# Patient Record
Sex: Male | Born: 1945
Health system: Southern US, Community
[De-identification: ages and names within clinical notes are randomized; demographics above are authoritative.]

---

## 2004-07-18 ENCOUNTER — Ambulatory Visit: Payer: Self-pay | Admitting: Family Medicine

## 2004-08-01 ENCOUNTER — Ambulatory Visit: Payer: Self-pay | Admitting: Cardiology

## 2004-09-05 ENCOUNTER — Ambulatory Visit: Payer: Self-pay | Admitting: Family Medicine

## 2004-09-20 ENCOUNTER — Ambulatory Visit: Payer: Self-pay | Admitting: Family Medicine

## 2004-09-22 ENCOUNTER — Ambulatory Visit: Payer: Self-pay

## 2004-09-26 ENCOUNTER — Ambulatory Visit: Payer: Self-pay | Admitting: Family Medicine

## 2004-10-03 ENCOUNTER — Ambulatory Visit: Payer: Self-pay | Admitting: Family Medicine

## 2006-05-13 ENCOUNTER — Encounter: Admission: RE | Admit: 2006-05-13 | Discharge: 2006-05-13 | Payer: Self-pay | Admitting: Internal Medicine

## 2008-10-07 ENCOUNTER — Inpatient Hospital Stay (HOSPITAL_COMMUNITY): Admission: RE | Admit: 2008-10-07 | Discharge: 2008-10-11 | Payer: Self-pay | Admitting: Orthopedic Surgery

## 2010-06-08 ENCOUNTER — Encounter: Payer: Self-pay | Admitting: Gastroenterology

## 2010-08-01 IMAGING — CR DG CHEST 2V
2 series · 2 of 2 positions shown · non-contrast
Comparison: None

CLINICAL DATA: preadmission radiograph.

CHEST - 2 VIEW

[view not recorded (1 of 2)]
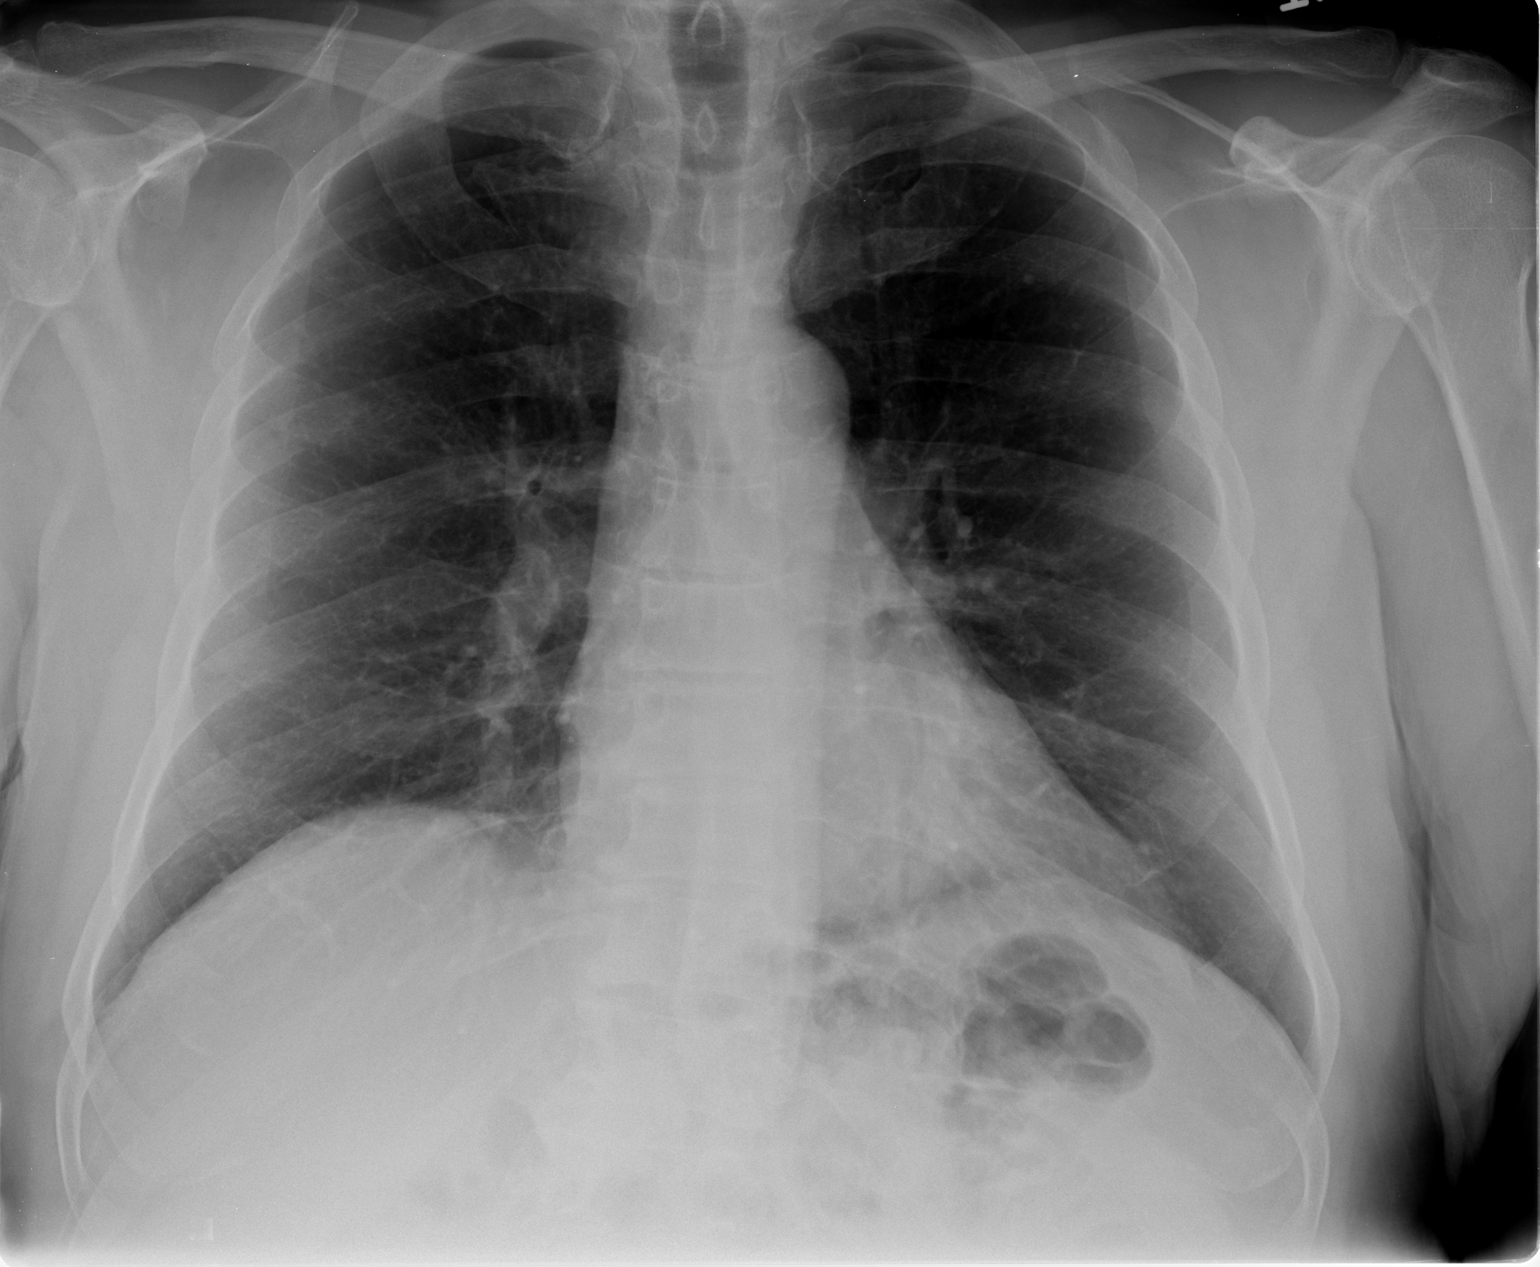

[view not recorded (2 of 2)]
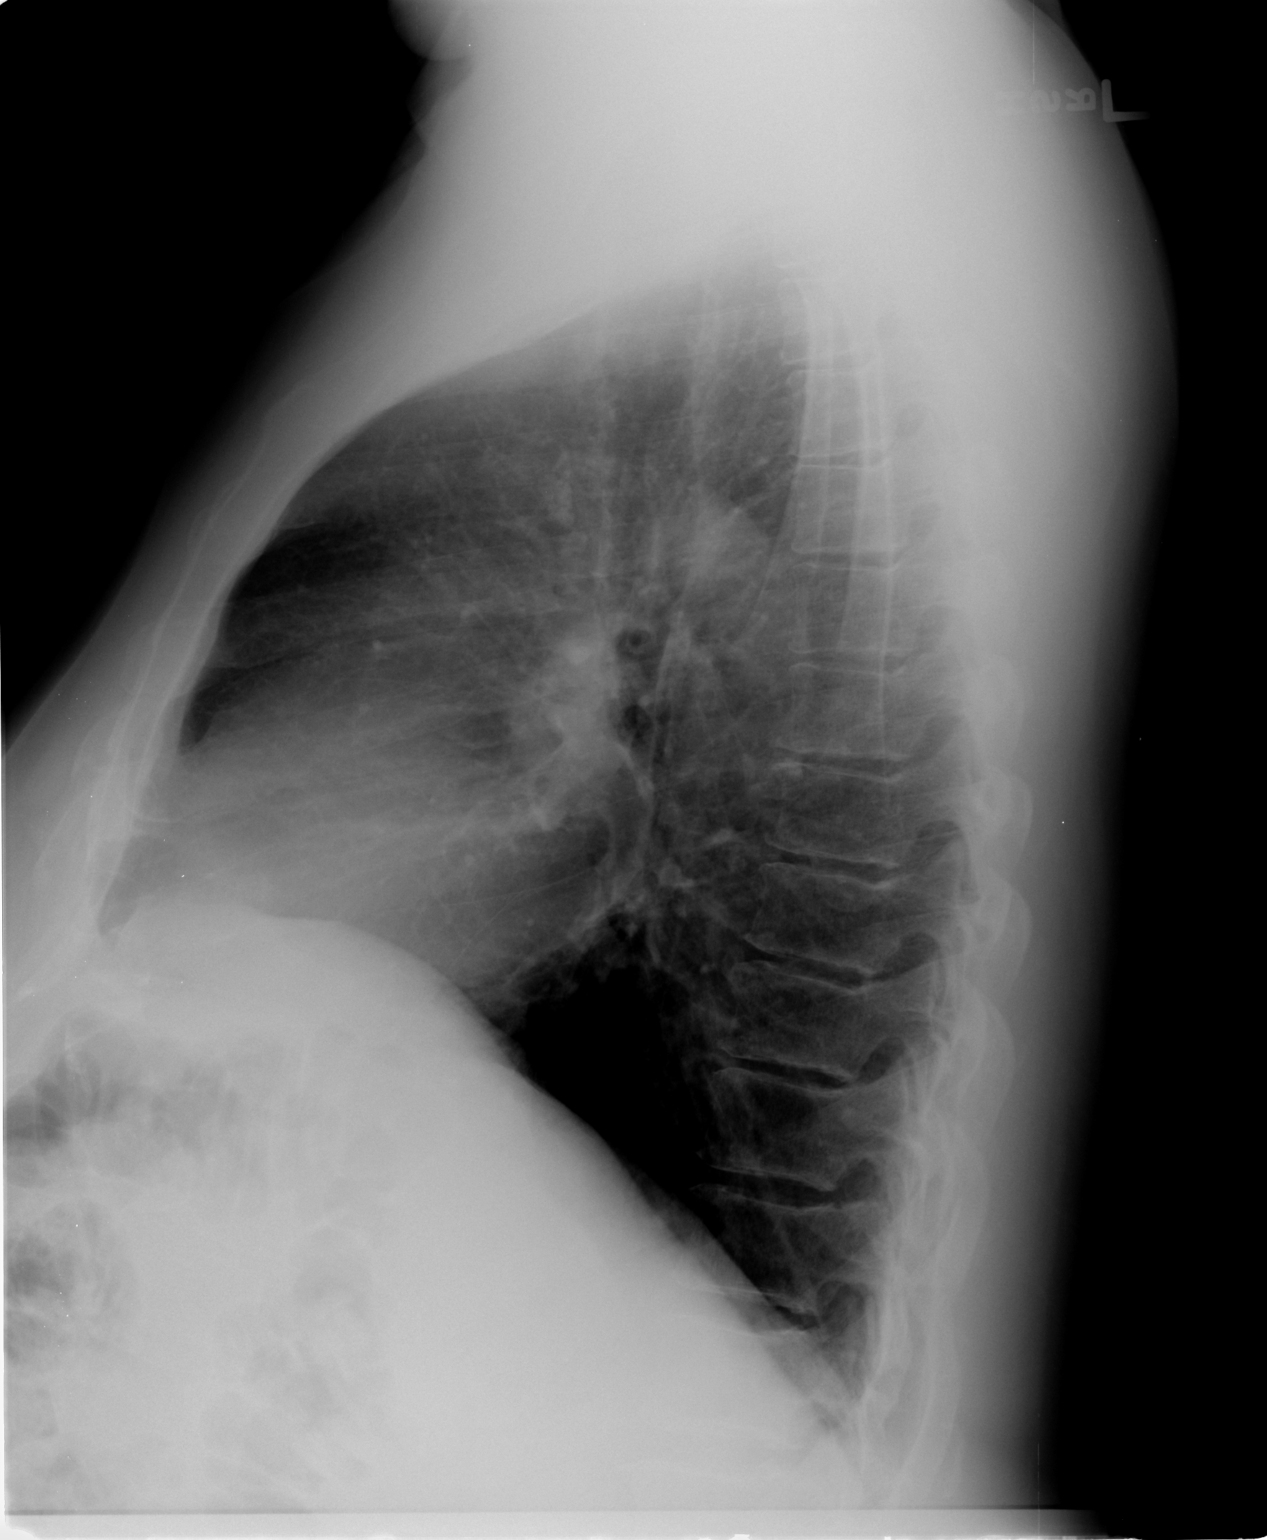

[2 of 2 positions shown; findings below may reference images not displayed]

FINDINGS: Heart size is normal.

No pleural effusion or pulmonary edema.

There is no airspace densities identified.

Review of the visualized osseous structures is negative.
IMPRESSION: 1.  No active cardiopulmonary disease.,

## 2010-10-18 NOTE — Letter (Signed)
Summary: Colonoscopy Letter  [facility]  [address] [place], [place] [address]   Phone: [phone]  Fax: [phone]      June 08, 2010 MRN: [id number]   Casey Willis [address] [place], [place]  [address]   Dear Mr. Mccowan,   According to your medical record, it is time for you to schedule a Colonoscopy. The American Cancer Society recommends this procedure as a method to detect early colon cancer. Patients with a family history of colon cancer, or a personal history of colon polyps or inflammatory bowel disease are at increased risk.  This letter has been generated based on the recommendations made at the time of your procedure. If you feel that in your particular situation this may no longer apply, please contact our office.  Please call our office at [phone] to schedule this appointment or to update your records at your earliest convenience.  Thank you for cooperating with [place] to provide you with the very best care possible.   Sincerely,   Barbette Hair. Arlyce Dice, M.D.  [facility] [phone]

## 2011-01-02 LAB — COMPREHENSIVE METABOLIC PANEL
Alkaline Phosphatase: 69 U/L (ref 39–117)
BUN: 16 mg/dL (ref 6–23)
CO2: 26 mEq/L (ref 19–32)
Chloride: 102 mEq/L (ref 96–112)
Creatinine, Ser: 0.86 mg/dL (ref 0.4–1.5)
GFR calc non Af Amer: >60 mL/min (ref >60)
Potassium: 4.6 mEq/L (ref 3.5–5.1)
Total Bilirubin: 0.9 mg/dL (ref 0.3–1.2)

## 2011-01-02 LAB — BASIC METABOLIC PANEL
BUN: 8 mg/dL (ref 6–23)
CO2: 29 mEq/L (ref 19–32)
Chloride: 103 mEq/L (ref 96–112)
Chloride: 97 mEq/L (ref 96–112)
Creatinine, Ser: 0.97 mg/dL (ref 0.4–1.5)
GFR calc Af Amer: >60 mL/min (ref >60)
GFR calc non Af Amer: >60 mL/min (ref >60)
Glucose, Bld: 119 mg/dL — ABNORMAL HIGH (ref 70–99)
Potassium: 3.8 mEq/L (ref 3.5–5.1)
Potassium: 3.9 mEq/L (ref 3.5–5.1)
Sodium: 133 mEq/L — ABNORMAL LOW (ref 135–145)
Sodium: 136 mEq/L (ref 135–145)

## 2011-01-02 LAB — CBC
HCT: 39.6 % (ref 39.0–52.0)
HCT: 27.6 % — ABNORMAL LOW (ref 39.0–52.0)
HCT: 32.3 % — ABNORMAL LOW (ref 39.0–52.0)
HCT: 32.6 % — ABNORMAL LOW (ref 39.0–52.0)
Hemoglobin: 13.3 g/dL (ref 13.0–17.0)
Hemoglobin: 10.9 g/dL — ABNORMAL LOW (ref 13.0–17.0)
Hemoglobin: 9.5 g/dL — ABNORMAL LOW (ref 13.0–17.0)
MCHC: 33.9 g/dL (ref 30.0–36.0)
MCHC: 34.4 g/dL (ref 30.0–36.0)
MCV: 90.4 fL (ref 78.0–100.0)
MCV: 89.9 fL (ref 78.0–100.0)
MCV: 90.3 fL (ref 78.0–100.0)
MCV: 90.5 fL (ref 78.0–100.0)
Platelets: 172 10*3/uL (ref 150–400)
Platelets: 189 10*3/uL (ref 150–400)
RBC: 4.38 MIL/uL (ref 4.22–5.81)
RBC: 3.05 MIL/uL — ABNORMAL LOW (ref 4.22–5.81)
RBC: 3.59 MIL/uL — ABNORMAL LOW (ref 4.22–5.81)
RDW: 14.3 % (ref 11.5–15.5)
WBC: 6.9 10*3/uL (ref 4.0–10.5)
WBC: 7.1 10*3/uL (ref 4.0–10.5)
WBC: 9.5 10*3/uL (ref 4.0–10.5)

## 2011-01-02 LAB — URINALYSIS, ROUTINE W REFLEX MICROSCOPIC
Bilirubin Urine: NEGATIVE
Glucose, UA: NEGATIVE mg/dL
Ketones, ur: NEGATIVE mg/dL
Protein, ur: NEGATIVE mg/dL
pH: 6.5 (ref 5.0–8.0)

## 2011-01-02 LAB — PROTIME-INR
INR: 1.8 — ABNORMAL HIGH (ref 0.00–1.49)
Prothrombin Time: 13.3 seconds (ref 11.6–15.2)
Prothrombin Time: 16.1 seconds — ABNORMAL HIGH (ref 11.6–15.2)

## 2011-01-02 LAB — ABO/RH: ABO/RH(D): O POS

## 2011-01-02 LAB — TYPE AND SCREEN

## 2011-01-02 LAB — DIFFERENTIAL
Basophils Absolute: 0 10*3/uL (ref 0.0–0.1)
Basophils Relative: 0 % (ref 0–1)
Eosinophils Relative: 2 % (ref 0–5)
Lymphocytes Relative: 26 % (ref 12–46)
Neutro Abs: 4.5 10*3/uL (ref 1.7–7.7)

## 2011-01-31 NOTE — Op Note (Signed)
[name], [name]             ACCOUNT NO.:  [id number]   MEDICAL RECORD NO.:  [id number]          PATIENT TYPE:  INP   LOCATION:  5024                         FACILITY:  [facility]   PHYSICIAN:  Casey Willis, M.D.   DATE OF BIRTH:  03/16/1946   DATE OF PROCEDURE:  10/07/2008  DATE OF DISCHARGE:                               OPERATIVE REPORT   PREOPERATIVE DIAGNOSIS:  End-stage degenerative joint disease, bilateral  knee.   POSTOPERATIVE DIAGNOSIS:  End-stage degenerative joint disease,  bilateral knee.   PROCEDURES:  1. Left total knee replacement with sigma system size five femur, size      five tibia, 12.5-mm bridging bearing, and a 41 mm all-polyethylene      patella.  2. Computer-assisted left total knee replacement.  3. Right total knee replacement with a sigma system size five femur,      size five tibia, 12.5-mm bridging bearing a 41-mm all polyethylene      patella.  4. Computer-assisted right total knee replacement.   SURGEON:  Casey Junior, MD on both the right and left knee.   ASSISTANT:  Marshia Ly, PA on both right and left knee.   BRIEF HISTORY:  Casey Willis is a 65 year old male with a long history of  having had significant degenerative joint disease of bilateral knees.  We treated conservatively for prolonged period of time.  We discussed  treatment options and felt that he needed knee replacements on both of  his knees.  He ultimately asked could these be done simultaneously and  we had a long discussion about morbidity and mortality, but we certainly  let him understand that there was about 4 times greater mortality to  doing bilateral total knees and doing one at a time.  Ultimately, he  just fell with his age and health that he wanted to have both done and  certainly we thought it was not unreasonable.  He was brought to the  operating room for this procedure.  We did take 2 units autologous blood  preoperatively to try to board off any issues  with postoperative blood  loss.  Because of the issues relative to instrumenting 4 canals, we felt  that computer assistance knee replacement would be critical to make this  as safe as possible and this was chosen to be used preoperatively.   PROCEDURE:  The patient brought to the operating room.  After adequate  anesthesia was obtained with general endotracheal anesthesia, the  patient was placed supine on the operating table.  Both knees were then  prepped and draped in usual sterile fashion.  Following this, attention  was turned to the left knee where after exsanguination of the knee,  blood pressure tourniquet was inflated to 350 mmHg.  Following this, a  midline incision was made.  Subcutaneous tissue taken down to the level  of the extensor mechanism.  A medial parapatellar arthrotomy was  undertaken, and the knee was exposed.  The retropatellar fat pad,  anterior and posterior cruciates, mediolateral meniscus were removed,  and attention was then turned to excision of osteophytes.  Two pins  were  then placed in the tibia, two pins in the femur to allow for computer  assistance, and the rays were placed, the registration process was  undertaken and that was about 30 minutes of surgical procedure.  Once  this was accomplished, the tibia was cut perpendicular to its long axis  under computer assistance.  The femur was cut perpendicular to the  anatomic axis under computer assistance.  Following this, the rotational  alignment guide was used with both computer assistance and with a  posterior referencing guide.  Once this was completed, attention was  turned towards making the anterior and posterior cuts, chamfer cuts, and  box cut.  The tibia was then sized to a 5, drilled and keeled and the  trial poly 10 was placed.  The knee went to slight hyperextension and  little bit of varus alignment, we put a 12/5 poly and the knee came back  perfectly into extension with the dead neutral  alignment.  Attention was  then turned to the patella, which was cut down to a level of 14 mm and a  41-mm all-polyethylene was chosen as a trial.  Lugs were drilled for  this.  Lugs were drilled for the femur.  All trial components were  removed.  The leg was thoroughly and copiously irrigated with pulsatile  lavage irrigation and the final components were cemented into place.  Once the components were allowed to harden, all excess bone cement was  removed.  A trial poly had been placed to allow a bone cement to harden  and the tourniquet was let down.  Bleeders controlled with  electrocautery.  Marcaine was injected in the posterior capsule as well  as around the medial and lateral side of the knee.  At this point,  attention was turned towards placement of a Hemovac drain, which was  ultimately connected to an Autovac drainage system.  At this time, the  final polyethylene was placed.  Knee was put through a range of motion.  Excellent range of motion, stability, perfect neutral long alignment  with computer assistance, and the computer pins were then removed.  Medial parapatellar arthrotomy was closed with 1 Vicryl running, skin  with 0 and 2-0 Vicryl and skin staples.  Sterile compressive dressing  was applied, and at this point attention was then turned to the right  knee.  The following procedures was performed on the right knee.  Because of a previous femoral plating, we knew we had to use computer  assistance as well as feeling that it was necessary because of the  bilateral nature of the procedure.  At this point, the right leg was  exsanguinated, blood pressure tourniquet was inflated to 350 mmHg.  Midline incision was made.  Subcutaneous tissues were dissected down to  the level of the extensor mechanism.  A medial parapatellar arthrotomy  was then undertaken.  Attention then turned towards the knee where  retropatellar fat pad, anterior posterior cruciates were excised as  well  as the medial lateral meniscus.  Osteophytes were removed.  Attention  was then turned towards placing two pins in the tibia, two pins in the  femur, and the arrays were placed and registration process undertaken  that was about half an hour to this surgical procedure.  Following this,  attention was turned towards cutting the tibia.  The tibia was cut  perpendicular to its long axis under computer assistance.  The femur was  then cut perpendicular to the anatomic  axis under computer assistance  and also following the posterior condyles as a reference.  Once this was  done, the anterior and posterior cuts were made as well as chamfer cuts  and a box cut and the attention was then turned to the tibia, which was  sized to a five, drilled and keeled and these trial components were  placed.  A 12.5-mm bridging bearing was placed, perfect neutral long  alignment, perfect gap balancing in extension and flexion.  There was an  interesting kind of rotational issue with the femur, which was difficult  to quite understand.  Certainly at 90 it was not bad, but at 120, there  certainly was quite a lift on from the medial side.  There was really  not much that could be done about at this time given the rotational  alignment had already been set at set at 90 degrees and I think we had  something to do with maybe rotational malunion of the femur.  At any  rate, attention was turned to the patella.  The femur was cut down to a  level of 14 and a 41-mm paddle was used and then lugs were drilled for  the patella.  Once this was completed, the trial patella was put in  place and knee put through a range of motion.  Excellent stability and  easy full extension and deep flexion.  The knee tracked reasonably well.  At this point, all trial components were removed.  Knee was copiously  and thoroughly lavaged, pulsatile lavage irrigation, and the final  components were cemented in place size five femur,  size five tibia, 12.5-  mm bridging bearing, and a 41-mm all-poly patella, and the clamp was  used on.  Once the cement was allowed to harden, tourniquet was let  down.  All bleeding was controlled with electrocautery.  Injection of  the posterior capsule for pain control and the final 12/5 poly was  placed and again range of motion was assessed and stability was  excellent, perfect neutral long alignment, computer rays were removed at  this time and a medium Hemovac drain was placed up into an Autovac.  The  medial parapatellar arthrotomy was then closed with 1 Vicryl running,  the skin with 0 and 2-0 Vicryl and skin staples.  Sterile compressive  dressing was applied to both knees at  this point and the patient was taken to recovery room and was noted to  be in satisfactory condition.  Estimated blood loss for the right knee  was less than 25 mL, the left knee had about 300 in the Autovac at the  time of the completion of the initial the case.      Casey Willis, M.D.  Electronically Signed     JLG/MEDQ  D:  10/07/2008  T:  10/08/2008  Job:  [id number]

## 2011-02-03 NOTE — Discharge Summary (Signed)
NAMEMANAS, HICKLING             ACCOUNT NO.:  [id number]   MEDICAL RECORD NO.:  [id number]          PATIENT TYPE:  INP   LOCATION:  5024                         FACILITY:  [facility]   PHYSICIAN:  Harvie Junior, M.D.   DATE OF BIRTH:  January 08, 1946   DATE OF ADMISSION:  10/07/2008  DATE OF DISCHARGE:  10/11/2008                               DISCHARGE SUMMARY   ADMITTING DIAGNOSES:  1. End-stage degenerative joint disease, bone-on-bone, right knee.  2. End-stage degenerative joint disease, bone-on-bone, left knee.  3. Hypertension.  4. Hyperlipidemia.   DISCHARGE DIAGNOSES:  1. End-stage degenerative joint disease, bone-on-bone, right knee.  2. End-stage degenerative joint disease, bone-on-bone, left knee.  3. Hypertension.  4. Hyperlipidemia.   PROCEDURES IN HOSPITAL:  Bilateral total knee arthroplasties, computer-  assisted, Harvie Junior, MD, on October 07, 2008.   CONSULTATIONS IN HOSPITAL:  None.   BRIEF HISTORY:  Mr. Ballweg is a friendly pleasant 65 year old male whom  we have followed for some time with chief complaint of bilateral knee  pain.  Weightbearing x-rays of bilateral knee shows that he had bone-on-  bone DJD in both knees.  He overtime had cortisone injections,  modification of his activity, and use of oral arthritis medications and  despite at this he continued to have pain in both knees with ambulation  and pain that is waking him up from his sleep.  Despite exhaustive  conservative treatment he continued to have symptoms and based upon his  clinical and radiographic findings he is felt to be a candidate for  bilateral total knee replacements and is admitted for this.   PERTINENT LABORATORY STUDIES:  EKG on admission showed normal sinus  rhythm, normal EKG.  Hemoglobin on admission was 13.3, hematocrit 39.6,  indices within normal limits.  On postop day #1, his hemoglobin was 9.5,  on postop day #2 was 10.9, on postop day #3 10.9.  Protime on admission  was  13.3 seconds and INR of 1.0 and PTT of 28.  On the day of discharge,  on Coumadin therapy, his protime was 21.8 seconds and INR of 1.8.  CMET  on admission showed no abnormalities.  BMET on postop day #1 and #2 also  looked good with just a slight decreased sodium of 133 on postop day #1.  Urinalysis on admission showed no abnormalities.  Two units of  autologous blood was transfused during this hospitalization.   HOSPITAL COURSE:  The patient was brought to the operating room where he  underwent bilateral total knee arthroplasties which is well described in  Dr. Luiz Blare' operative note, this was computer assisted.  Preop, he was  given 2 g Ancef IV prophylactically as well as 8 mg of IV gentamicin.  He is given Ancef IV x24 hours prophylactically postoperatively.  He has  been on PCA morphine pump for pain control and bilateral CPM machines  for use as well as incentive spirometry.  Physical therapy was ordered  for the patient to get out of bed to the chair, weightbearing as  tolerated bilaterally.  The patient was given autologous blood  preoperatively on  the day of surgery.  In the late afternoon, he was  given 2 units of autologous blood.  He was gotten out of bed to chair  with physical therapy.  On postop day #1, his hemoglobin was 9.5,  hematocrit 27.6.  He was without complaints.  He was afebrile and his  vital signs were stable.  His lungs were clear to auscultation.  His  heart was regular rate and rhythm.  Both of his knee wounds were benign  and NV was intact to  both lower extremities.  A BMET was checked and  the renal function was great.  On postop day #2, he complained of  moderate knee pain.  He was taking fluids and voiding without  difficulty.  He was up to the bedside commode.  He had a T-max  temperature of 101 and then was found to be afebrile and his vital signs  were stable.  His O2 sats were 98% on room air.  His bilateral knee  dressings were changed and Hemovac  drains placed at the time of surgery  were removed.  His hemoglobin was 10.9.  His potassium was 3.9 and  sodium 136.  INR was 1.8.  His dressings were changed and his Hemovac  drains were pulled.  His PCA morphine pump was discontinued.  His IV was  converted to a saline lock.  He is to continue with physical therapy and  occupational therapy and continue on Coumadin for DVT prophylaxis.  On  postop day #3, he had complaints of moderate knee pain.  He was overall  progressing well with physical therapy.  He was not really stable to go  home at that point.  He had a hemoglobin of 10.9 and an INR of 1.9.  On  postop day #4, the patient was doing well and he stated he was ready to  go home.  He was taking fluids and voiding without difficulty.  He had  positive bowel movement.  He had made good progress with physical  therapy.  His vital signs were stable and he was afebrile.  His O2 sats  were 99% on room air.  His INR was 1.8.  His bilateral knee dressings  were clean and dry.  His wound was benign.  His calves were soft.  No  shortness of breath, whatsoever.  No chest pain.  He is discharged home  in improved condition on a regular diet.  He was given Rx for Percocet 5  mg p.r.n.. for pain and Robaxin 750 mg p.r.n. spasm.  He was also given  an Rx Coumadin x1 month postop for DVT prophylaxis.  He will need home  health physical therapy 3 times a week and Home Health RN for protimes  and Coumadin management and have CPMs bilaterally.  He will follow up  with Dr. Luiz Blare in the office in 10 days.  He will ambulate and  weightbearing as tolerated bilaterally.      Marshia Ly, P.A.      Harvie Junior, M.D.  Electronically Signed    JB/MEDQ  D:  12/02/2008  T:  12/03/2008  Job:  [id number]   cc:   Thora Lance, M.D.

## 2012-05-21 ENCOUNTER — Encounter: Payer: Self-pay | Admitting: Gastroenterology

## 2013-09-22 DIAGNOSIS — H259 Unspecified age-related cataract: Secondary | ICD-10-CM | POA: Diagnosis not present

## 2013-10-24 DIAGNOSIS — H60399 Other infective otitis externa, unspecified ear: Secondary | ICD-10-CM | POA: Diagnosis not present

## 2013-12-08 DIAGNOSIS — H902 Conductive hearing loss, unspecified: Secondary | ICD-10-CM | POA: Diagnosis not present

## 2013-12-08 DIAGNOSIS — H6120 Impacted cerumen, unspecified ear: Secondary | ICD-10-CM | POA: Diagnosis not present

## 2013-12-08 DIAGNOSIS — H60399 Other infective otitis externa, unspecified ear: Secondary | ICD-10-CM | POA: Diagnosis not present

## 2014-01-13 DIAGNOSIS — E785 Hyperlipidemia, unspecified: Secondary | ICD-10-CM | POA: Diagnosis not present

## 2014-01-13 DIAGNOSIS — I10 Essential (primary) hypertension: Secondary | ICD-10-CM | POA: Diagnosis not present

## 2014-04-06 DIAGNOSIS — H60399 Other infective otitis externa, unspecified ear: Secondary | ICD-10-CM | POA: Diagnosis not present

## 2014-04-06 DIAGNOSIS — H6120 Impacted cerumen, unspecified ear: Secondary | ICD-10-CM | POA: Diagnosis not present

## 2014-07-21 DIAGNOSIS — Z23 Encounter for immunization: Secondary | ICD-10-CM | POA: Diagnosis not present

## 2014-08-04 DIAGNOSIS — Z1389 Encounter for screening for other disorder: Secondary | ICD-10-CM | POA: Diagnosis not present

## 2014-08-04 DIAGNOSIS — R7301 Impaired fasting glucose: Secondary | ICD-10-CM | POA: Diagnosis not present

## 2014-08-04 DIAGNOSIS — I10 Essential (primary) hypertension: Secondary | ICD-10-CM | POA: Diagnosis not present

## 2014-08-04 DIAGNOSIS — E669 Obesity, unspecified: Secondary | ICD-10-CM | POA: Diagnosis not present

## 2014-08-04 DIAGNOSIS — Z23 Encounter for immunization: Secondary | ICD-10-CM | POA: Diagnosis not present

## 2014-08-04 DIAGNOSIS — Z0000 Encounter for general adult medical examination without abnormal findings: Secondary | ICD-10-CM | POA: Diagnosis not present

## 2014-08-04 DIAGNOSIS — Z6833 Body mass index (BMI) 33.0-33.9, adult: Secondary | ICD-10-CM | POA: Diagnosis not present

## 2014-08-04 DIAGNOSIS — R21 Rash and other nonspecific skin eruption: Secondary | ICD-10-CM | POA: Diagnosis not present

## 2014-08-04 DIAGNOSIS — E780 Pure hypercholesterolemia: Secondary | ICD-10-CM | POA: Diagnosis not present

## 2014-08-07 DIAGNOSIS — M1990 Unspecified osteoarthritis, unspecified site: Secondary | ICD-10-CM | POA: Diagnosis not present

## 2014-09-24 DIAGNOSIS — H04129 Dry eye syndrome of unspecified lacrimal gland: Secondary | ICD-10-CM | POA: Diagnosis not present

## 2014-09-24 DIAGNOSIS — H259 Unspecified age-related cataract: Secondary | ICD-10-CM | POA: Diagnosis not present

## 2014-09-30 DIAGNOSIS — E780 Pure hypercholesterolemia: Secondary | ICD-10-CM | POA: Diagnosis not present

## 2014-09-30 DIAGNOSIS — M1990 Unspecified osteoarthritis, unspecified site: Secondary | ICD-10-CM | POA: Diagnosis not present

## 2014-10-27 DIAGNOSIS — H6123 Impacted cerumen, bilateral: Secondary | ICD-10-CM | POA: Diagnosis not present

## 2014-10-27 DIAGNOSIS — H60332 Swimmer's ear, left ear: Secondary | ICD-10-CM | POA: Diagnosis not present

## 2014-12-04 DIAGNOSIS — K529 Noninfective gastroenteritis and colitis, unspecified: Secondary | ICD-10-CM | POA: Diagnosis not present

## 2014-12-16 DIAGNOSIS — J069 Acute upper respiratory infection, unspecified: Secondary | ICD-10-CM | POA: Diagnosis not present

## 2015-02-03 DIAGNOSIS — R7301 Impaired fasting glucose: Secondary | ICD-10-CM | POA: Diagnosis not present

## 2015-02-03 DIAGNOSIS — I10 Essential (primary) hypertension: Secondary | ICD-10-CM | POA: Diagnosis not present

## 2015-02-03 DIAGNOSIS — E780 Pure hypercholesterolemia: Secondary | ICD-10-CM | POA: Diagnosis not present

## 2015-03-05 DIAGNOSIS — B356 Tinea cruris: Secondary | ICD-10-CM | POA: Diagnosis not present

## 2015-03-23 DIAGNOSIS — H60332 Swimmer's ear, left ear: Secondary | ICD-10-CM | POA: Diagnosis not present

## 2015-03-23 DIAGNOSIS — H6121 Impacted cerumen, right ear: Secondary | ICD-10-CM | POA: Diagnosis not present

## 2015-08-09 DIAGNOSIS — Z1389 Encounter for screening for other disorder: Secondary | ICD-10-CM | POA: Diagnosis not present

## 2015-08-09 DIAGNOSIS — E7800 Pure hypercholesterolemia, unspecified: Secondary | ICD-10-CM | POA: Diagnosis not present

## 2015-08-09 DIAGNOSIS — Z125 Encounter for screening for malignant neoplasm of prostate: Secondary | ICD-10-CM | POA: Diagnosis not present

## 2015-08-09 DIAGNOSIS — Z6833 Body mass index (BMI) 33.0-33.9, adult: Secondary | ICD-10-CM | POA: Diagnosis not present

## 2015-08-09 DIAGNOSIS — I10 Essential (primary) hypertension: Secondary | ICD-10-CM | POA: Diagnosis not present

## 2015-08-09 DIAGNOSIS — Z23 Encounter for immunization: Secondary | ICD-10-CM | POA: Diagnosis not present

## 2015-08-09 DIAGNOSIS — E669 Obesity, unspecified: Secondary | ICD-10-CM | POA: Diagnosis not present

## 2015-08-09 DIAGNOSIS — R7301 Impaired fasting glucose: Secondary | ICD-10-CM | POA: Diagnosis not present

## 2015-08-09 DIAGNOSIS — Z0000 Encounter for general adult medical examination without abnormal findings: Secondary | ICD-10-CM | POA: Diagnosis not present

## 2015-11-10 DIAGNOSIS — H6121 Impacted cerumen, right ear: Secondary | ICD-10-CM | POA: Diagnosis not present

## 2015-11-10 DIAGNOSIS — H60332 Swimmer's ear, left ear: Secondary | ICD-10-CM | POA: Diagnosis not present

## 2015-11-18 DIAGNOSIS — H259 Unspecified age-related cataract: Secondary | ICD-10-CM | POA: Diagnosis not present

## 2016-02-07 DIAGNOSIS — I10 Essential (primary) hypertension: Secondary | ICD-10-CM | POA: Diagnosis not present

## 2016-05-11 DIAGNOSIS — H60332 Swimmer's ear, left ear: Secondary | ICD-10-CM | POA: Diagnosis not present

## 2016-05-11 DIAGNOSIS — H6121 Impacted cerumen, right ear: Secondary | ICD-10-CM | POA: Diagnosis not present

## 2016-07-12 DIAGNOSIS — Z23 Encounter for immunization: Secondary | ICD-10-CM | POA: Diagnosis not present

## 2016-07-13 DIAGNOSIS — L814 Other melanin hyperpigmentation: Secondary | ICD-10-CM | POA: Diagnosis not present

## 2016-07-13 DIAGNOSIS — D1801 Hemangioma of skin and subcutaneous tissue: Secondary | ICD-10-CM | POA: Diagnosis not present

## 2016-07-13 DIAGNOSIS — D2261 Melanocytic nevi of right upper limb, including shoulder: Secondary | ICD-10-CM | POA: Diagnosis not present

## 2016-07-13 DIAGNOSIS — D2262 Melanocytic nevi of left upper limb, including shoulder: Secondary | ICD-10-CM | POA: Diagnosis not present

## 2016-07-13 DIAGNOSIS — L821 Other seborrheic keratosis: Secondary | ICD-10-CM | POA: Diagnosis not present

## 2016-07-13 DIAGNOSIS — D225 Melanocytic nevi of trunk: Secondary | ICD-10-CM | POA: Diagnosis not present

## 2016-08-25 DIAGNOSIS — I10 Essential (primary) hypertension: Secondary | ICD-10-CM | POA: Diagnosis not present

## 2016-08-25 DIAGNOSIS — R202 Paresthesia of skin: Secondary | ICD-10-CM | POA: Diagnosis not present

## 2016-08-25 DIAGNOSIS — E7800 Pure hypercholesterolemia, unspecified: Secondary | ICD-10-CM | POA: Diagnosis not present

## 2016-08-25 DIAGNOSIS — Z1389 Encounter for screening for other disorder: Secondary | ICD-10-CM | POA: Diagnosis not present

## 2016-08-25 DIAGNOSIS — E669 Obesity, unspecified: Secondary | ICD-10-CM | POA: Diagnosis not present

## 2016-08-25 DIAGNOSIS — Z6833 Body mass index (BMI) 33.0-33.9, adult: Secondary | ICD-10-CM | POA: Diagnosis not present

## 2016-08-25 DIAGNOSIS — Z0000 Encounter for general adult medical examination without abnormal findings: Secondary | ICD-10-CM | POA: Diagnosis not present

## 2016-08-25 DIAGNOSIS — R7301 Impaired fasting glucose: Secondary | ICD-10-CM | POA: Diagnosis not present

## 2016-09-14 DIAGNOSIS — H6121 Impacted cerumen, right ear: Secondary | ICD-10-CM | POA: Diagnosis not present

## 2016-09-14 DIAGNOSIS — H60332 Swimmer's ear, left ear: Secondary | ICD-10-CM | POA: Diagnosis not present

## 2016-11-23 DIAGNOSIS — H259 Unspecified age-related cataract: Secondary | ICD-10-CM | POA: Diagnosis not present

## 2016-11-23 DIAGNOSIS — H04129 Dry eye syndrome of unspecified lacrimal gland: Secondary | ICD-10-CM | POA: Diagnosis not present

## 2016-11-23 DIAGNOSIS — H60332 Swimmer's ear, left ear: Secondary | ICD-10-CM | POA: Diagnosis not present

## 2016-11-23 DIAGNOSIS — H6122 Impacted cerumen, left ear: Secondary | ICD-10-CM | POA: Diagnosis not present

## 2016-12-07 DIAGNOSIS — H02831 Dermatochalasis of right upper eyelid: Secondary | ICD-10-CM | POA: Diagnosis not present

## 2016-12-07 DIAGNOSIS — H02834 Dermatochalasis of left upper eyelid: Secondary | ICD-10-CM | POA: Diagnosis not present

## 2017-02-09 DIAGNOSIS — H5340 Unspecified visual field defects: Secondary | ICD-10-CM | POA: Diagnosis not present

## 2017-03-15 DIAGNOSIS — I10 Essential (primary) hypertension: Secondary | ICD-10-CM | POA: Diagnosis not present

## 2017-03-20 DIAGNOSIS — H6122 Impacted cerumen, left ear: Secondary | ICD-10-CM | POA: Diagnosis not present

## 2017-05-29 DIAGNOSIS — H60332 Swimmer's ear, left ear: Secondary | ICD-10-CM | POA: Diagnosis not present

## 2017-05-29 DIAGNOSIS — H6122 Impacted cerumen, left ear: Secondary | ICD-10-CM | POA: Diagnosis not present

## 2017-06-28 DIAGNOSIS — Z23 Encounter for immunization: Secondary | ICD-10-CM | POA: Diagnosis not present

## 2017-07-13 DIAGNOSIS — D1801 Hemangioma of skin and subcutaneous tissue: Secondary | ICD-10-CM | POA: Diagnosis not present

## 2017-07-13 DIAGNOSIS — L821 Other seborrheic keratosis: Secondary | ICD-10-CM | POA: Diagnosis not present

## 2017-07-13 DIAGNOSIS — D692 Other nonthrombocytopenic purpura: Secondary | ICD-10-CM | POA: Diagnosis not present

## 2017-07-13 DIAGNOSIS — D225 Melanocytic nevi of trunk: Secondary | ICD-10-CM | POA: Diagnosis not present

## 2017-08-30 DIAGNOSIS — E7800 Pure hypercholesterolemia, unspecified: Secondary | ICD-10-CM | POA: Diagnosis not present

## 2017-08-30 DIAGNOSIS — I10 Essential (primary) hypertension: Secondary | ICD-10-CM | POA: Diagnosis not present

## 2017-08-30 DIAGNOSIS — Z1389 Encounter for screening for other disorder: Secondary | ICD-10-CM | POA: Diagnosis not present

## 2017-08-30 DIAGNOSIS — R7301 Impaired fasting glucose: Secondary | ICD-10-CM | POA: Diagnosis not present

## 2017-08-30 DIAGNOSIS — E781 Pure hyperglyceridemia: Secondary | ICD-10-CM | POA: Diagnosis not present

## 2017-08-30 DIAGNOSIS — E669 Obesity, unspecified: Secondary | ICD-10-CM | POA: Diagnosis not present

## 2017-08-30 DIAGNOSIS — Z0000 Encounter for general adult medical examination without abnormal findings: Secondary | ICD-10-CM | POA: Diagnosis not present

## 2017-08-30 DIAGNOSIS — Z125 Encounter for screening for malignant neoplasm of prostate: Secondary | ICD-10-CM | POA: Diagnosis not present

## 2017-11-29 DIAGNOSIS — H259 Unspecified age-related cataract: Secondary | ICD-10-CM | POA: Diagnosis not present

## 2017-11-29 DIAGNOSIS — H04123 Dry eye syndrome of bilateral lacrimal glands: Secondary | ICD-10-CM | POA: Diagnosis not present

## 2017-12-26 DIAGNOSIS — J209 Acute bronchitis, unspecified: Secondary | ICD-10-CM | POA: Diagnosis not present

## 2017-12-26 DIAGNOSIS — N529 Male erectile dysfunction, unspecified: Secondary | ICD-10-CM | POA: Diagnosis not present

## 2018-02-26 DIAGNOSIS — M25561 Pain in right knee: Secondary | ICD-10-CM | POA: Diagnosis not present

## 2018-02-26 DIAGNOSIS — M25562 Pain in left knee: Secondary | ICD-10-CM | POA: Diagnosis not present

## 2018-02-26 DIAGNOSIS — G5601 Carpal tunnel syndrome, right upper limb: Secondary | ICD-10-CM | POA: Diagnosis not present

## 2018-02-26 DIAGNOSIS — Z96651 Presence of right artificial knee joint: Secondary | ICD-10-CM | POA: Diagnosis not present

## 2018-03-01 DIAGNOSIS — N529 Male erectile dysfunction, unspecified: Secondary | ICD-10-CM | POA: Diagnosis not present

## 2018-03-01 DIAGNOSIS — R7301 Impaired fasting glucose: Secondary | ICD-10-CM | POA: Diagnosis not present

## 2018-03-01 DIAGNOSIS — G5601 Carpal tunnel syndrome, right upper limb: Secondary | ICD-10-CM | POA: Diagnosis not present

## 2018-03-01 DIAGNOSIS — I10 Essential (primary) hypertension: Secondary | ICD-10-CM | POA: Diagnosis not present

## 2018-03-01 DIAGNOSIS — E7800 Pure hypercholesterolemia, unspecified: Secondary | ICD-10-CM | POA: Diagnosis not present

## 2018-03-12 DIAGNOSIS — Z96651 Presence of right artificial knee joint: Secondary | ICD-10-CM | POA: Diagnosis not present

## 2018-03-12 DIAGNOSIS — G5601 Carpal tunnel syndrome, right upper limb: Secondary | ICD-10-CM | POA: Diagnosis not present

## 2018-03-12 DIAGNOSIS — Z96652 Presence of left artificial knee joint: Secondary | ICD-10-CM | POA: Diagnosis not present

## 2018-04-16 DIAGNOSIS — G5601 Carpal tunnel syndrome, right upper limb: Secondary | ICD-10-CM | POA: Diagnosis not present

## 2018-04-16 DIAGNOSIS — M67911 Unspecified disorder of synovium and tendon, right shoulder: Secondary | ICD-10-CM | POA: Diagnosis not present

## 2018-06-06 DIAGNOSIS — G5601 Carpal tunnel syndrome, right upper limb: Secondary | ICD-10-CM | POA: Diagnosis not present

## 2018-06-26 DIAGNOSIS — Z23 Encounter for immunization: Secondary | ICD-10-CM | POA: Diagnosis not present

## 2018-07-05 ENCOUNTER — Encounter (INDEPENDENT_AMBULATORY_CARE_PROVIDER_SITE_OTHER): Payer: Medicare Other | Admitting: Neurology

## 2018-07-05 ENCOUNTER — Ambulatory Visit (INDEPENDENT_AMBULATORY_CARE_PROVIDER_SITE_OTHER): Payer: Medicare Other | Admitting: Neurology

## 2018-07-05 DIAGNOSIS — Z0289 Encounter for other administrative examinations: Secondary | ICD-10-CM

## 2018-07-05 DIAGNOSIS — G5601 Carpal tunnel syndrome, right upper limb: Secondary | ICD-10-CM

## 2018-07-05 NOTE — Procedures (Signed)
        Full Name: Casey Willis Gender: Male MRN #: [id number] Date of Birth: 03/06/2046    Visit Date: 07/05/18 11:20 Age: 72 Years 2 Months Old Examining Physician: Marcial Pacas, MD  Referring Physician: Dr. Dorna Leitz History: 72 year old right-handed male presented with intermittent right hand paresthesia involving the first 3 and half fingers  On examination: He has mild right abductor pollicis brevis, opponens, decreased light touch pinprick first 3 fingertips, right wrist Tinel sign was positive.  Summary of the tests:  Nerve conduction study: Right median sensory response was absent. Right median motor responses showed moderately prolonged distal latency, with mildly decreased the C map amplitude, conduction velocity was normal.  Right ulnar sensory and motor responses were normal.  Electromyography: Selective needle examination were performed at the right upper extremity muscles. The only abnormality is mildly decreased recruitment pattern of the right abductor pollicis brevis, there is no active denervation, motor unit morphology was normal.     Conclusion: This is an abnormal study.  There is electrodiagnostic evidence of right median neuropathy across the wrist, consistent with moderately severe right carpal tunnel syndromes, demyelinating nature, there is no evidence of axonal loss.    ------------------------------- Marcial Pacas M.D. PhD  [facility] [place], [place] [address] Tel: [phone] Fax: [phone]        [facility]    Nerve / Sites Muscle Latency Ref. Amplitude Ref. Rel Amp Segments Distance Velocity Ref. Area    ms ms mV mV %  cm m/s m/s mVms  R Median - APB     Wrist APB 7.2 ?4.4 3.9 ?4.0 100 Wrist - APB 7   18.1     Upper arm APB 11.9  3.9  99.6 Upper arm - Wrist 23 49 ?49 17.9  R Ulnar - ADM     Wrist ADM 2.7 ?3.3 13.2 ?6.0 100 Wrist - ADM 7   34.8     B.Elbow ADM 7.1  11.6  88.2 B.Elbow - Wrist 22 50 ?49 33.5     A.Elbow ADM 9.0  11.3  97 A.Elbow - B.Elbow 10 52 ?49 33.4         A.Elbow - Wrist             SNC    Nerve / Sites Rec. Site Peak Lat Ref.  Amp Ref. Segments Distance    ms ms V V  cm  R Median - Orthodromic (Dig II, Mid palm)     Dig II Wrist NR ?3.4 NR ?10 Dig II - Wrist 13  R Ulnar - Orthodromic, (Dig V, Mid palm)     Dig V Wrist 3.0 ?3.1 10 ?5 Dig V - Wrist 56         F  Wave    Nerve F Lat Ref.   ms ms  R Ulnar - ADM 27.6 ?32.0       EMG full       EMG Summary Table    Spontaneous MUAP Recruitment  Muscle IA Fib PSW Fasc Other Amp Dur. Poly Pattern  R. Abductor pollicis brevis Normal None None None _______ Normal Normal Normal Reduced  R. First dorsal interosseous Normal None None None _______ Normal Normal Normal Normal  R. Pronator teres Normal None None None _______ Normal Normal Normal Normal  R. Deltoid Normal None None None _______ Normal Normal Normal Normal  R. Brachioradialis Normal None None None _______ Normal Normal Normal Normal

## 2018-07-16 DIAGNOSIS — G5601 Carpal tunnel syndrome, right upper limb: Secondary | ICD-10-CM | POA: Diagnosis not present

## 2018-07-17 DIAGNOSIS — L738 Other specified follicular disorders: Secondary | ICD-10-CM | POA: Diagnosis not present

## 2018-07-17 DIAGNOSIS — D225 Melanocytic nevi of trunk: Secondary | ICD-10-CM | POA: Diagnosis not present

## 2018-07-17 DIAGNOSIS — L821 Other seborrheic keratosis: Secondary | ICD-10-CM | POA: Diagnosis not present

## 2018-07-17 DIAGNOSIS — D1801 Hemangioma of skin and subcutaneous tissue: Secondary | ICD-10-CM | POA: Diagnosis not present

## 2018-08-21 DIAGNOSIS — G5601 Carpal tunnel syndrome, right upper limb: Secondary | ICD-10-CM | POA: Diagnosis not present

## 2018-08-29 DIAGNOSIS — Z9889 Other specified postprocedural states: Secondary | ICD-10-CM | POA: Diagnosis not present

## 2018-08-30 DIAGNOSIS — R7301 Impaired fasting glucose: Secondary | ICD-10-CM | POA: Diagnosis not present

## 2018-08-30 DIAGNOSIS — S6411XD Injury of median nerve at wrist and hand level of right arm, subsequent encounter: Secondary | ICD-10-CM | POA: Diagnosis not present

## 2018-08-30 DIAGNOSIS — I10 Essential (primary) hypertension: Secondary | ICD-10-CM | POA: Diagnosis not present

## 2018-08-30 DIAGNOSIS — E7800 Pure hypercholesterolemia, unspecified: Secondary | ICD-10-CM | POA: Diagnosis not present

## 2018-09-02 DIAGNOSIS — R7301 Impaired fasting glucose: Secondary | ICD-10-CM | POA: Diagnosis not present

## 2018-09-02 DIAGNOSIS — Z1389 Encounter for screening for other disorder: Secondary | ICD-10-CM | POA: Diagnosis not present

## 2018-09-02 DIAGNOSIS — Z0000 Encounter for general adult medical examination without abnormal findings: Secondary | ICD-10-CM | POA: Diagnosis not present

## 2018-09-02 DIAGNOSIS — E7800 Pure hypercholesterolemia, unspecified: Secondary | ICD-10-CM | POA: Diagnosis not present

## 2018-09-02 DIAGNOSIS — I10 Essential (primary) hypertension: Secondary | ICD-10-CM | POA: Diagnosis not present

## 2018-09-03 DIAGNOSIS — S6411XD Injury of median nerve at wrist and hand level of right arm, subsequent encounter: Secondary | ICD-10-CM | POA: Diagnosis not present

## 2018-09-05 DIAGNOSIS — Z9889 Other specified postprocedural states: Secondary | ICD-10-CM | POA: Diagnosis not present

## 2018-09-05 DIAGNOSIS — G5601 Carpal tunnel syndrome, right upper limb: Secondary | ICD-10-CM | POA: Diagnosis not present

## 2018-09-10 DIAGNOSIS — S6411XD Injury of median nerve at wrist and hand level of right arm, subsequent encounter: Secondary | ICD-10-CM | POA: Diagnosis not present

## 2018-09-23 DIAGNOSIS — S6411XD Injury of median nerve at wrist and hand level of right arm, subsequent encounter: Secondary | ICD-10-CM | POA: Diagnosis not present

## 2018-09-26 DIAGNOSIS — Z9889 Other specified postprocedural states: Secondary | ICD-10-CM | POA: Diagnosis not present

## 2018-10-24 DIAGNOSIS — Z9889 Other specified postprocedural states: Secondary | ICD-10-CM | POA: Diagnosis not present

## 2018-12-02 DIAGNOSIS — M65342 Trigger finger, left ring finger: Secondary | ICD-10-CM | POA: Diagnosis not present

## 2019-05-20 DIAGNOSIS — M65342 Trigger finger, left ring finger: Secondary | ICD-10-CM | POA: Diagnosis not present

## 2019-07-10 DIAGNOSIS — Z23 Encounter for immunization: Secondary | ICD-10-CM | POA: Diagnosis not present

## 2019-07-17 DIAGNOSIS — D225 Melanocytic nevi of trunk: Secondary | ICD-10-CM | POA: Diagnosis not present

## 2019-07-17 DIAGNOSIS — L821 Other seborrheic keratosis: Secondary | ICD-10-CM | POA: Diagnosis not present

## 2019-07-17 DIAGNOSIS — L570 Actinic keratosis: Secondary | ICD-10-CM | POA: Diagnosis not present

## 2019-07-17 DIAGNOSIS — L814 Other melanin hyperpigmentation: Secondary | ICD-10-CM | POA: Diagnosis not present

## 2019-07-17 DIAGNOSIS — L738 Other specified follicular disorders: Secondary | ICD-10-CM | POA: Diagnosis not present

## 2019-09-25 DIAGNOSIS — I10 Essential (primary) hypertension: Secondary | ICD-10-CM | POA: Diagnosis not present

## 2019-09-25 DIAGNOSIS — R7301 Impaired fasting glucose: Secondary | ICD-10-CM | POA: Diagnosis not present

## 2019-09-25 DIAGNOSIS — E7800 Pure hypercholesterolemia, unspecified: Secondary | ICD-10-CM | POA: Diagnosis not present

## 2019-09-25 DIAGNOSIS — N529 Male erectile dysfunction, unspecified: Secondary | ICD-10-CM | POA: Diagnosis not present

## 2020-01-15 DIAGNOSIS — M67912 Unspecified disorder of synovium and tendon, left shoulder: Secondary | ICD-10-CM | POA: Diagnosis not present

## 2020-02-05 DIAGNOSIS — H04129 Dry eye syndrome of unspecified lacrimal gland: Secondary | ICD-10-CM | POA: Diagnosis not present

## 2020-02-05 DIAGNOSIS — H259 Unspecified age-related cataract: Secondary | ICD-10-CM | POA: Diagnosis not present

## 2020-04-22 DIAGNOSIS — H01001 Unspecified blepharitis right upper eyelid: Secondary | ICD-10-CM | POA: Diagnosis not present

## 2020-05-04 DIAGNOSIS — M25512 Pain in left shoulder: Secondary | ICD-10-CM | POA: Diagnosis not present

## 2020-05-04 DIAGNOSIS — M67912 Unspecified disorder of synovium and tendon, left shoulder: Secondary | ICD-10-CM | POA: Diagnosis not present

## 2020-05-04 DIAGNOSIS — H0011 Chalazion right upper eyelid: Secondary | ICD-10-CM | POA: Diagnosis not present

## 2020-05-04 DIAGNOSIS — H02834 Dermatochalasis of left upper eyelid: Secondary | ICD-10-CM | POA: Diagnosis not present

## 2020-05-04 DIAGNOSIS — H2513 Age-related nuclear cataract, bilateral: Secondary | ICD-10-CM | POA: Diagnosis not present

## 2020-05-04 DIAGNOSIS — H02831 Dermatochalasis of right upper eyelid: Secondary | ICD-10-CM | POA: Diagnosis not present

## 2020-05-27 DIAGNOSIS — H0011 Chalazion right upper eyelid: Secondary | ICD-10-CM | POA: Diagnosis not present

## 2020-06-17 DIAGNOSIS — Z23 Encounter for immunization: Secondary | ICD-10-CM | POA: Diagnosis not present

## 2020-06-29 DIAGNOSIS — Z23 Encounter for immunization: Secondary | ICD-10-CM | POA: Diagnosis not present

## 2020-08-30 DIAGNOSIS — D225 Melanocytic nevi of trunk: Secondary | ICD-10-CM | POA: Diagnosis not present

## 2020-08-30 DIAGNOSIS — L821 Other seborrheic keratosis: Secondary | ICD-10-CM | POA: Diagnosis not present

## 2020-08-30 DIAGNOSIS — L814 Other melanin hyperpigmentation: Secondary | ICD-10-CM | POA: Diagnosis not present

## 2020-09-21 ENCOUNTER — Other Ambulatory Visit: Payer: Self-pay

## 2020-09-21 ENCOUNTER — Encounter (INDEPENDENT_AMBULATORY_CARE_PROVIDER_SITE_OTHER): Payer: Self-pay | Admitting: Otolaryngology

## 2020-09-21 ENCOUNTER — Ambulatory Visit (INDEPENDENT_AMBULATORY_CARE_PROVIDER_SITE_OTHER): Payer: Medicare Other | Admitting: Otolaryngology

## 2020-09-21 VITALS — Temp 97.9°F

## 2020-09-21 DIAGNOSIS — H60312 Diffuse otitis externa, left ear: Secondary | ICD-10-CM | POA: Diagnosis not present

## 2020-09-21 DIAGNOSIS — H6123 Impacted cerumen, bilateral: Secondary | ICD-10-CM

## 2020-09-21 NOTE — Progress Notes (Signed)
HPI: Casey Willis is a 74 y.o. male who returns today for evaluation of some mild discomfort in the left ear.  He has had previous history of external otitis last treated here in September 2018.  He has done well since that time.  He obtain hearing aids 2 years ago.Marland Kitchen  No past medical history on file.  Social History   Socioeconomic History  . Marital status: Married    Spouse name: Not on file  . Number of children: Not on file  . Years of education: Not on file  . Highest education level: Not on file  Occupational History  . Not on file  Tobacco Use  . Smoking status: Never Smoker  . Smokeless tobacco: Never Used  Substance and Sexual Activity  . Alcohol use: Not on file  . Drug use: Not on file  . Sexual activity: Not on file  Other Topics Concern  . Not on file  Social History Narrative  . Not on file   Social Determinants of Health   Financial Resource Strain: Not on file  Food Insecurity: Not on file  Transportation Needs: Not on file  Physical Activity: Not on file  Stress: Not on file  Social Connections: Not on file   No family history on file. No Known Allergies Prior to Admission medications   Not on File     Positive ROS: Otherwise negative  All other systems have been reviewed and were otherwise negative with the exception of those mentioned in the HPI and as above.  Physical Exam: Constitutional: Alert, well-appearing, no acute distress Ears: External ears without lesions or tenderness.  He has some slight inflammatory changes of the left ear canal with desquamation of the skin that was cleaned with suction and hydroperoxide.  After cleaning the left ear canal I applied CSF powder to the left ear canal.  The right ear canal was cleaned with a small amount of wax that was cleaned with suction.  Both TMs were clear with good mobility on pneumatic otoscopy. Nasal: External nose without lesions. Clear nasal passages Oral: Lips and gums without lesions.  Tongue and palate mucosa without lesions. Posterior oropharynx clear. Neck: No palpable adenopathy or masses.  No significant palpable adenopathy in the upper neck or the parotid region. Respiratory: Breathing comfortably  Skin: No facial/neck lesions or rash noted.  Cerumen impaction removal  Date/Time: 09/21/2020 3:07 PM Performed by: Drema Halon, MD Authorized by: Drema Halon, MD   Consent:    Consent obtained:  Verbal   Consent given by:  Patient   Risks discussed:  Pain and bleeding Procedure details:    Location:  L ear and R ear   Procedure type: suction   Post-procedure details:    Inspection:  TM intact and canal normal   Hearing quality:  Improved   Patient tolerance of procedure:  Tolerated well, no immediate complications Comments:     Patient had slight inflammatory changes of the left ear canal with desquamation of the skin that was cleaned with suction and hydroperoxide.  The right ear canal had just minimal cerumen buildup that was cleaned with suction.  Both TMs were clear.  Applied CSF powder to the left ear only.    Assessment: Mild inflammatory changes of left ear canal that was cleaned in the office and applied CSF powder. A little bit of wax buildup.  Plan: He will follow-up as needed.   Narda Bonds, MD

## 2020-09-27 ENCOUNTER — Telehealth (INDEPENDENT_AMBULATORY_CARE_PROVIDER_SITE_OTHER): Payer: Self-pay

## 2020-09-27 ENCOUNTER — Other Ambulatory Visit (INDEPENDENT_AMBULATORY_CARE_PROVIDER_SITE_OTHER): Payer: Self-pay | Admitting: Otolaryngology

## 2020-09-27 ENCOUNTER — Telehealth (INDEPENDENT_AMBULATORY_CARE_PROVIDER_SITE_OTHER): Payer: Self-pay | Admitting: Otolaryngology

## 2020-09-27 DIAGNOSIS — H60312 Diffuse otitis externa, left ear: Secondary | ICD-10-CM

## 2020-09-27 MED ORDER — CORTISPORIN-TC 3.3-3-10-0.5 MG/ML OT SUSP: 4.0000 [drp] | Freq: Three times a day (TID) | OTIC | 0 refills | Status: AC

## 2020-09-27 NOTE — Telephone Encounter (Signed)
Casey Willis called about some drainage from his left ear after having the ear cleaned last week.  He had mild inflammatory changes on the left side I applied CSF powder at that time.  He wears bilateral hearing aids. I prescribed Cortisporin otic suspension 4 drops in the left ear 3 times daily for the next week. Recommended leaving the hearing aid out of the left ear. He will follow-up Friday if symptoms have not improved.

## 2020-09-28 DIAGNOSIS — R7301 Impaired fasting glucose: Secondary | ICD-10-CM | POA: Diagnosis not present

## 2020-09-28 DIAGNOSIS — E669 Obesity, unspecified: Secondary | ICD-10-CM | POA: Diagnosis not present

## 2020-09-28 DIAGNOSIS — E7800 Pure hypercholesterolemia, unspecified: Secondary | ICD-10-CM | POA: Diagnosis not present

## 2020-09-28 DIAGNOSIS — I10 Essential (primary) hypertension: Secondary | ICD-10-CM | POA: Diagnosis not present

## 2020-09-30 ENCOUNTER — Ambulatory Visit (INDEPENDENT_AMBULATORY_CARE_PROVIDER_SITE_OTHER): Payer: Self-pay | Admitting: Otolaryngology

## 2020-10-15 ENCOUNTER — Other Ambulatory Visit: Payer: Self-pay

## 2020-10-15 ENCOUNTER — Ambulatory Visit (INDEPENDENT_AMBULATORY_CARE_PROVIDER_SITE_OTHER): Payer: Medicare Other | Admitting: Otolaryngology

## 2020-10-15 VITALS — Temp 96.4°F

## 2020-10-15 DIAGNOSIS — H60312 Diffuse otitis externa, left ear: Secondary | ICD-10-CM

## 2020-10-15 DIAGNOSIS — H6122 Impacted cerumen, left ear: Secondary | ICD-10-CM

## 2020-10-15 NOTE — Progress Notes (Signed)
HPI: Casey Willis is a 75 y.o. male who returns today for evaluation of left ear blockage and left ear infection.  He has had a longstanding history of recurrent left external otitis.  He wears hearing aids.  I have previously clean the ear and applied gentian violet to the ear that seems to help the most..  No past medical history on file.  Social History   Socioeconomic History  . Marital status: Married    Spouse name: Not on file  . Number of children: Not on file  . Years of education: Not on file  . Highest education level: Not on file  Occupational History  . Not on file  Tobacco Use  . Smoking status: Never Smoker  . Smokeless tobacco: Never Used  Substance and Sexual Activity  . Alcohol use: Not on file  . Drug use: Not on file  . Sexual activity: Not on file  Other Topics Concern  . Not on file  Social History Narrative  . Not on file   Social Determinants of Health   Financial Resource Strain: Not on file  Food Insecurity: Not on file  Transportation Needs: Not on file  Physical Activity: Not on file  Stress: Not on file  Social Connections: Not on file   No family history on file. No Known Allergies Prior to Admission medications   Not on File     Positive ROS: Otherwise negative  All other systems have been reviewed and were otherwise negative with the exception of those mentioned in the HPI and as above.  Physical Exam: Constitutional: Alert, well-appearing, no acute distress Ears: External ears without lesions or tenderness.  Right ear canal and right TM are clear.  Left ear canal reveals fungal elements and swollen ear canal with debris adjacent to the TM.  This was cleaned using suction and hydrogen peroxide.  After copiously cleaning the ear canal I applied gentian violet and CSF powder to the left ear canal. Nasal: External nose without lesions.. Clear nasal passages Oral: Lips and gums without lesions. Tongue and palate mucosa without  lesions. Posterior oropharynx clear. Neck: No palpable adenopathy or masses Respiratory: Breathing comfortably  Skin: No facial/neck lesions or rash noted.  Cerumen impaction removal  Date/Time: 10/15/2020 10:55 AM Performed by: Rozetta Nunnery, MD Authorized by: Rozetta Nunnery, MD   Consent:    Consent obtained:  Verbal   Consent given by:  Patient   Risks discussed:  Pain and bleeding Procedure details:    Location:  L ear   Procedure type: suction   Post-procedure details:    Inspection:  TM intact   Hearing quality:  Improved   Patient tolerance of procedure:  Tolerated well, no immediate complications Comments:     Left ear canal was cleaned with hydroperoxide and alcohol or ear rinses.  After cleaning the ear canal I applied gentian violet, Ciprodex and CSF powder to the left ear.    Assessment: Chronic left external otitis with debris obstructing the hearing aid  Plan: This was cleaned in the office today.  Recommended leaving the hearing aid out for another day or 2. He will follow-up as needed   Radene Journey, MD

## 2020-11-18 ENCOUNTER — Other Ambulatory Visit: Payer: Self-pay

## 2020-11-18 ENCOUNTER — Ambulatory Visit (INDEPENDENT_AMBULATORY_CARE_PROVIDER_SITE_OTHER): Payer: Medicare Other | Admitting: Otolaryngology

## 2020-11-18 VITALS — Temp 97.7°F

## 2020-11-18 DIAGNOSIS — H6122 Impacted cerumen, left ear: Secondary | ICD-10-CM | POA: Diagnosis not present

## 2020-11-18 DIAGNOSIS — H60312 Diffuse otitis externa, left ear: Secondary | ICD-10-CM | POA: Diagnosis not present

## 2020-11-18 NOTE — Progress Notes (Signed)
HPI: Casey Willis is a 75 y.o. male who returns today for evaluation of left ear draining.  He has bilateral hearing aids.  I saw him last a little over a month ago with left external otitis.  The treatment at that time resolved symptoms for several weeks but over the last couple weeks is gotten bad again with the left ear being blocked and has had some drainage.  He has been using alcohol vinegar ear rinses for the past week and a half.  No past medical history on file.  Social History   Socioeconomic History  . Marital status: Married    Spouse name: Not on file  . Number of children: Not on file  . Years of education: Not on file  . Highest education level: Not on file  Occupational History  . Not on file  Tobacco Use  . Smoking status: Never Smoker  . Smokeless tobacco: Never Used  Substance and Sexual Activity  . Alcohol use: Not on file  . Drug use: Not on file  . Sexual activity: Not on file  Other Topics Concern  . Not on file  Social History Narrative  . Not on file   Social Determinants of Health   Financial Resource Strain: Not on file  Food Insecurity: Not on file  Transportation Needs: Not on file  Physical Activity: Not on file  Stress: Not on file  Social Connections: Not on file   No family history on file. No Known Allergies Prior to Admission medications   Not on File     Positive ROS: Otherwise negative  All other systems have been reviewed and were otherwise negative with the exception of those mentioned in the HPI and as above.  Physical Exam: Constitutional: Alert, well-appearing, no acute distress Ears: External ears without lesions or tenderness.  Right ear canal and right TM are clear.  Left ear canal is obstructed with some drainage.  This was cleaned with suction and he has chronic left external otitis with a little bit of granulation tissue superiorly adjacent to the short process of the malleus.  After copiously cleaning the ear with  hydroperoxide and alcohol I applied Ciprodex, gentian violet and CSF powder to the left ear only. Nasal: External nose without lesions.. Clear nasal passages Oral: Lips and gums without lesions. Tongue and palate mucosa without lesions. Posterior oropharynx clear. Neck: No palpable adenopathy or masses Respiratory: Breathing comfortably  Skin: No facial/neck lesions or rash noted.  Cerumen impaction removal  Date/Time: 11/18/2020 5:06 PM Performed by: Rozetta Nunnery, MD Authorized by: Rozetta Nunnery, MD   Consent:    Consent obtained:  Verbal   Consent given by:  Patient   Risks discussed:  Pain and bleeding Procedure details:    Location:  L ear Post-procedure details:    Inspection:  TM intact   Hearing quality:  Improved   Patient tolerance of procedure:  Tolerated well, no immediate complications Comments:     Patient with chronic left external otitis with drainage adjacent to the TM that was cleaned with suction.  After cleaning the ear canal I applied gentian violet, Ciprodex and CSF powder to the left ear.    Assessment: Chronic left external otitis.  Plan: Ear canal was cleaned in the office today and applied gentian violet, Ciprodex and CSF powder to the left ear.  Recommended leaving his hearing aid out for the next 4 to 5 days and keeping the ear dry.  He will follow-up  in 10 to 12 days for recheck.   Radene Journey, MD

## 2020-11-29 ENCOUNTER — Other Ambulatory Visit: Payer: Self-pay

## 2020-11-29 ENCOUNTER — Ambulatory Visit (INDEPENDENT_AMBULATORY_CARE_PROVIDER_SITE_OTHER): Payer: Medicare Other | Admitting: Otolaryngology

## 2020-11-29 ENCOUNTER — Encounter (INDEPENDENT_AMBULATORY_CARE_PROVIDER_SITE_OTHER): Payer: Self-pay | Admitting: Otolaryngology

## 2020-11-29 VITALS — Temp 96.3°F

## 2020-11-29 DIAGNOSIS — H60312 Diffuse otitis externa, left ear: Secondary | ICD-10-CM | POA: Diagnosis not present

## 2020-11-29 NOTE — Progress Notes (Signed)
HPI: Casey Willis is a 75 y.o. male who returns today for evaluation of recurrent external otitis.  He was seen 10 days ago with bad left external otitis.  This was treated with gentian violet and Ciprodex and CSF powder in his ear is doing much better today with no further pain itching or drainage.  He has some mild itching on the right side.  He is hearing well and using his hearing aids..  No past medical history on file.  Social History   Socioeconomic History  . Marital status: Married    Spouse name: Not on file  . Number of children: Not on file  . Years of education: Not on file  . Highest education level: Not on file  Occupational History  . Not on file  Tobacco Use  . Smoking status: Never Smoker  . Smokeless tobacco: Never Used  Substance and Sexual Activity  . Alcohol use: Not on file  . Drug use: Not on file  . Sexual activity: Not on file  Other Topics Concern  . Not on file  Social History Narrative  . Not on file   Social Determinants of Health   Financial Resource Strain: Not on file  Food Insecurity: Not on file  Transportation Needs: Not on file  Physical Activity: Not on file  Stress: Not on file  Social Connections: Not on file   No family history on file. No Known Allergies Prior to Admission medications   Not on File     Positive ROS: Otherwise negative  All other systems have been reviewed and were otherwise negative with the exception of those mentioned in the HPI and as above.  Physical Exam: Constitutional: Alert, well-appearing, no acute distress Ears: External ears without lesions or tenderness.  Ear canals are clear bilaterally he has some dry wax in the right ear canal that was cleaned with suction.  The TM is clear.  Left ear canal reveals gentian violet in place with no evidence of recurrent external otitis with a clear left TM. Nasal: External nose without lesions. . Clear nasal passages Oral: Lips and gums without lesions.  Tongue and palate mucosa without lesions. Posterior oropharynx clear. Neck: No palpable adenopathy or masses Respiratory: Breathing comfortably  Skin: No facial/neck lesions or rash noted.  Procedures  Assessment: History of recurrent otitis externa and patient who wears hearing aids.  Plan: Doing much better today with clear exam today with no evidence of external otitis.   Radene Journey, MD

## 2021-02-04 DIAGNOSIS — Z20822 Contact with and (suspected) exposure to covid-19: Secondary | ICD-10-CM | POA: Diagnosis not present

## 2021-04-07 DIAGNOSIS — H04129 Dry eye syndrome of unspecified lacrimal gland: Secondary | ICD-10-CM | POA: Diagnosis not present

## 2021-04-07 DIAGNOSIS — H259 Unspecified age-related cataract: Secondary | ICD-10-CM | POA: Diagnosis not present

## 2021-07-07 DIAGNOSIS — Z23 Encounter for immunization: Secondary | ICD-10-CM | POA: Diagnosis not present

## 2021-09-06 DIAGNOSIS — D225 Melanocytic nevi of trunk: Secondary | ICD-10-CM | POA: Diagnosis not present

## 2021-09-06 DIAGNOSIS — L814 Other melanin hyperpigmentation: Secondary | ICD-10-CM | POA: Diagnosis not present

## 2021-09-06 DIAGNOSIS — L821 Other seborrheic keratosis: Secondary | ICD-10-CM | POA: Diagnosis not present

## 2021-09-06 DIAGNOSIS — H61021 Chronic perichondritis of right external ear: Secondary | ICD-10-CM | POA: Diagnosis not present

## 2021-09-06 DIAGNOSIS — D1801 Hemangioma of skin and subcutaneous tissue: Secondary | ICD-10-CM | POA: Diagnosis not present

## 2021-09-30 DIAGNOSIS — Z23 Encounter for immunization: Secondary | ICD-10-CM | POA: Diagnosis not present

## 2021-09-30 DIAGNOSIS — E669 Obesity, unspecified: Secondary | ICD-10-CM | POA: Diagnosis not present

## 2021-09-30 DIAGNOSIS — Z0000 Encounter for general adult medical examination without abnormal findings: Secondary | ICD-10-CM | POA: Diagnosis not present

## 2021-09-30 DIAGNOSIS — I10 Essential (primary) hypertension: Secondary | ICD-10-CM | POA: Diagnosis not present

## 2021-09-30 DIAGNOSIS — R7301 Impaired fasting glucose: Secondary | ICD-10-CM | POA: Diagnosis not present

## 2021-09-30 DIAGNOSIS — E7800 Pure hypercholesterolemia, unspecified: Secondary | ICD-10-CM | POA: Diagnosis not present

## 2021-09-30 DIAGNOSIS — N529 Male erectile dysfunction, unspecified: Secondary | ICD-10-CM | POA: Diagnosis not present

## 2021-09-30 DIAGNOSIS — Z1389 Encounter for screening for other disorder: Secondary | ICD-10-CM | POA: Diagnosis not present

## 2021-10-20 DIAGNOSIS — M79644 Pain in right finger(s): Secondary | ICD-10-CM | POA: Diagnosis not present

## 2021-11-19 DIAGNOSIS — M79641 Pain in right hand: Secondary | ICD-10-CM | POA: Diagnosis not present

## 2021-11-19 DIAGNOSIS — M79644 Pain in right finger(s): Secondary | ICD-10-CM | POA: Diagnosis not present

## 2021-12-06 DIAGNOSIS — L089 Local infection of the skin and subcutaneous tissue, unspecified: Secondary | ICD-10-CM | POA: Diagnosis not present

## 2022-07-01 DIAGNOSIS — Z23 Encounter for immunization: Secondary | ICD-10-CM | POA: Diagnosis not present

## 2022-09-14 DIAGNOSIS — L821 Other seborrheic keratosis: Secondary | ICD-10-CM | POA: Diagnosis not present

## 2022-09-14 DIAGNOSIS — L814 Other melanin hyperpigmentation: Secondary | ICD-10-CM | POA: Diagnosis not present

## 2022-09-14 DIAGNOSIS — D1801 Hemangioma of skin and subcutaneous tissue: Secondary | ICD-10-CM | POA: Diagnosis not present

## 2022-11-17 DIAGNOSIS — Z23 Encounter for immunization: Secondary | ICD-10-CM | POA: Diagnosis not present

## 2022-11-17 DIAGNOSIS — R7301 Impaired fasting glucose: Secondary | ICD-10-CM | POA: Diagnosis not present

## 2022-11-17 DIAGNOSIS — E7800 Pure hypercholesterolemia, unspecified: Secondary | ICD-10-CM | POA: Diagnosis not present

## 2022-11-17 DIAGNOSIS — I10 Essential (primary) hypertension: Secondary | ICD-10-CM | POA: Diagnosis not present

## 2022-11-17 DIAGNOSIS — Z0000 Encounter for general adult medical examination without abnormal findings: Secondary | ICD-10-CM | POA: Diagnosis not present

## 2022-11-17 DIAGNOSIS — Z1211 Encounter for screening for malignant neoplasm of colon: Secondary | ICD-10-CM | POA: Diagnosis not present

## 2022-11-17 DIAGNOSIS — N529 Male erectile dysfunction, unspecified: Secondary | ICD-10-CM | POA: Diagnosis not present

## 2022-11-17 DIAGNOSIS — Z125 Encounter for screening for malignant neoplasm of prostate: Secondary | ICD-10-CM | POA: Diagnosis not present

## 2022-11-17 DIAGNOSIS — R7309 Other abnormal glucose: Secondary | ICD-10-CM | POA: Diagnosis not present

## 2022-11-17 DIAGNOSIS — E669 Obesity, unspecified: Secondary | ICD-10-CM | POA: Diagnosis not present

## 2022-11-25 DIAGNOSIS — Z1212 Encounter for screening for malignant neoplasm of rectum: Secondary | ICD-10-CM | POA: Diagnosis not present

## 2022-11-25 DIAGNOSIS — Z1211 Encounter for screening for malignant neoplasm of colon: Secondary | ICD-10-CM | POA: Diagnosis not present

## 2023-01-18 DIAGNOSIS — H04123 Dry eye syndrome of bilateral lacrimal glands: Secondary | ICD-10-CM | POA: Diagnosis not present

## 2023-01-18 DIAGNOSIS — H259 Unspecified age-related cataract: Secondary | ICD-10-CM | POA: Diagnosis not present

## 2023-09-18 DIAGNOSIS — L821 Other seborrheic keratosis: Secondary | ICD-10-CM | POA: Diagnosis not present

## 2023-09-18 DIAGNOSIS — L820 Inflamed seborrheic keratosis: Secondary | ICD-10-CM | POA: Diagnosis not present

## 2023-09-18 DIAGNOSIS — L814 Other melanin hyperpigmentation: Secondary | ICD-10-CM | POA: Diagnosis not present

## 2023-09-18 DIAGNOSIS — D225 Melanocytic nevi of trunk: Secondary | ICD-10-CM | POA: Diagnosis not present

## 2023-10-29 DIAGNOSIS — Z23 Encounter for immunization: Secondary | ICD-10-CM | POA: Diagnosis not present

## 2023-11-19 DIAGNOSIS — I10 Essential (primary) hypertension: Secondary | ICD-10-CM | POA: Diagnosis not present

## 2023-11-19 DIAGNOSIS — R0989 Other specified symptoms and signs involving the circulatory and respiratory systems: Secondary | ICD-10-CM | POA: Diagnosis not present

## 2023-11-19 DIAGNOSIS — Z0000 Encounter for general adult medical examination without abnormal findings: Secondary | ICD-10-CM | POA: Diagnosis not present

## 2023-11-19 DIAGNOSIS — N529 Male erectile dysfunction, unspecified: Secondary | ICD-10-CM | POA: Diagnosis not present

## 2023-11-19 DIAGNOSIS — E7800 Pure hypercholesterolemia, unspecified: Secondary | ICD-10-CM | POA: Diagnosis not present

## 2023-11-19 DIAGNOSIS — R7301 Impaired fasting glucose: Secondary | ICD-10-CM | POA: Diagnosis not present

## 2023-11-19 DIAGNOSIS — E669 Obesity, unspecified: Secondary | ICD-10-CM | POA: Diagnosis not present

## 2023-11-19 DIAGNOSIS — Z125 Encounter for screening for malignant neoplasm of prostate: Secondary | ICD-10-CM | POA: Diagnosis not present

## 2023-12-31 DIAGNOSIS — R972 Elevated prostate specific antigen [PSA]: Secondary | ICD-10-CM | POA: Diagnosis not present

## 2023-12-31 DIAGNOSIS — E781 Pure hyperglyceridemia: Secondary | ICD-10-CM | POA: Diagnosis not present

## 2024-01-25 DIAGNOSIS — H259 Unspecified age-related cataract: Secondary | ICD-10-CM | POA: Diagnosis not present

## 2024-01-25 DIAGNOSIS — H04129 Dry eye syndrome of unspecified lacrimal gland: Secondary | ICD-10-CM | POA: Diagnosis not present

## 2024-01-29 DIAGNOSIS — M25562 Pain in left knee: Secondary | ICD-10-CM | POA: Diagnosis not present

## 2024-05-12 DIAGNOSIS — R1032 Left lower quadrant pain: Secondary | ICD-10-CM | POA: Diagnosis not present

## 2024-06-11 NOTE — Telephone Encounter (Signed)
 Pt called into office after Dr. Ethyl sent in Rx for ear drops, cortisporin  ear drops, w/medicare they were $265.00. Pharm referred Ofloxin eye drops to be used in ears at $4.00. 4 to 5 drops TID, no refills.

## 2024-08-19 DIAGNOSIS — Z23 Encounter for immunization: Secondary | ICD-10-CM | POA: Diagnosis not present
